# Patient Record
Sex: Female | Born: 1990 | Race: White | Hispanic: No | Marital: Single | Smoking: Never smoker
Health system: Southern US, Community
[De-identification: ages and names within clinical notes are randomized; demographics above are authoritative.]

## PROBLEM LIST (undated history)

## (undated) DIAGNOSIS — I8390 Asymptomatic varicose veins of unspecified lower extremity: Secondary | ICD-10-CM

## (undated) HISTORY — PX: NO PAST SURGERIES: SHX2092

## (undated) HISTORY — DX: Asymptomatic varicose veins of unspecified lower extremity: I83.90

---

## 2018-04-27 ENCOUNTER — Other Ambulatory Visit: Payer: Self-pay

## 2018-04-27 DIAGNOSIS — I83893 Varicose veins of bilateral lower extremities with other complications: Secondary | ICD-10-CM

## 2018-05-01 ENCOUNTER — Encounter: Payer: Self-pay | Admitting: Surgery

## 2018-05-29 ENCOUNTER — Other Ambulatory Visit: Payer: Self-pay

## 2018-05-29 ENCOUNTER — Encounter: Payer: Self-pay | Admitting: Surgery

## 2018-05-29 ENCOUNTER — Ambulatory Visit (HOSPITAL_COMMUNITY)
Admission: RE | Admit: 2018-05-29 | Discharge: 2018-05-29 | Disposition: A | Discharge status: Home / Self Care | Payer: BC Managed Care – PPO | Source: Ambulatory Visit | Attending: Surgery | Admitting: Surgery

## 2018-05-29 ENCOUNTER — Ambulatory Visit (INDEPENDENT_AMBULATORY_CARE_PROVIDER_SITE_OTHER): Payer: BC Managed Care – PPO | Admitting: Surgery

## 2018-05-29 VITALS — BP 128/78 | HR 88 | Temp 98.2°F | Resp 14 | Ht 73.0 in | Wt 250.0 lb

## 2018-05-29 DIAGNOSIS — I83893 Varicose veins of bilateral lower extremities with other complications: Secondary | ICD-10-CM | POA: Diagnosis present

## 2018-05-29 NOTE — Progress Notes (Signed)
[facility] [place]  Patient name: Brenda Pena MRN: [id number] DOB: [birth year] Sex: female   REQUESTING PROVIDER:    Burnis Medin   REASON FOR CONSULT:    Painful varicose veins  HISTORY OF PRESENT ILLNESS:   Brenda Pena is a 28 y.o. female, who is referred for evaluation of varicose veins in her left leg.  The patient states that she recently had an episode of redness and swelling and pain over the course of a varicose vein going across her left thigh.  She was treated with antibiotics as well as warm compresses and this appears to be getting better.  She does note a family history of varicose veins in her mother and father.  She occasionally have swelling in her left leg.  She does wear compression stockings on occasion.  She is a non-smoker.  PAST MEDICAL HISTORY    Past Medical History:  Diagnosis Date  . Varicose vein of leg      FAMILY HISTORY   Family History  Problem Relation Age of Onset  . Arthritis Mother   . Heart disease Mother   . Hypertension Mother   . Atrial fibrillation Mother   . Diabetes Father   . Hypertension Father     SOCIAL HISTORY:   Social History   Socioeconomic History  . Marital status: Unknown    Spouse name: Not on file  . Number of children: Not on file  . Years of education: Not on file  . Highest education level: Not on file  Occupational History  . Not on file  Social Needs  . Financial resource strain: Not on file  . Food insecurity:    Worry: Not on file    Inability: Not on file  . Transportation needs:    Medical: Not on file    Non-medical: Not on file  Tobacco Use  . Smoking status: Never Smoker  . Smokeless tobacco: Never Used  Substance and Sexual Activity  . Alcohol use: Yes    Comment: seldom  . Drug use: Never  . Sexual activity: Not on file  Lifestyle  . Physical activity:    Days per week: Not on file    Minutes per session: Not on  file  . Stress: Not on file  Relationships  . Social connections:    Talks on phone: Not on file    Gets together: Not on file    Attends religious service: Not on file    Active member of club or organization: Not on file    Attends meetings of clubs or organizations: Not on file    Relationship status: Not on file  . Intimate partner violence:    Fear of current or ex partner: Not on file    Emotionally abused: Not on file    Physically abused: Not on file    Forced sexual activity: Not on file  Other Topics Concern  . Not on file  Social History Narrative  . Not on file    ALLERGIES:    No Known Allergies  CURRENT MEDICATIONS:    Current Outpatient Medications  Medication Sig Dispense Refill  . medroxyPROGESTERone (DEPO-PROVERA) 150 MG/ML injection Inject 150 mg into the muscle every 3 (three) months.     No current facility-administered medications for this visit.     REVIEW OF SYSTEMS:   [X]  denotes positive finding, [ ]  denotes negative finding Cardiac  Comments:  Chest pain or chest pressure:    Shortness  of breath upon exertion:    Short of breath when lying flat:    Irregular heart rhythm:        Vascular    Pain in calf, thigh, or hip brought on by ambulation:    Pain in feet at night that wakes you up from your sleep:     Blood clot in your veins:    Leg swelling:  x       Pulmonary    Oxygen at home:    Productive cough:     Wheezing:         Neurologic    Sudden weakness in arms or legs:     Sudden numbness in arms or legs:     Sudden onset of difficulty speaking or slurred speech:    Temporary loss of vision in one eye:     Problems with dizziness:         Gastrointestinal    Blood in stool:      Vomited blood:         Genitourinary    Burning when urinating:     Blood in urine:        Psychiatric    Major depression:         Hematologic    Bleeding problems:    Problems with blood clotting too easily:        Skin    Rashes  or ulcers:        Constitutional    Fever or chills:     PHYSICAL EXAM:   Vitals:   05/29/18 1358  BP: 128/78  Pulse: 88  Resp: 14  Temp: 98.2 F (36.8 C)  TempSrc: Oral  SpO2: 96%  Weight: 250 lb (113.4 kg)  Height: 6\' 1"  (1.854 m)    GENERAL: The patient is a well-nourished female, in no acute distress. The vital signs are documented above. CARDIAC: There is a regular rate and rhythm.  VASCULAR: No significant leg swelling today.  The area of concern is no longer prominent. PULMONARY: Nonlabored respirations ABDOMEN: Soft and non-tender with normal pitched bowel sounds.  MUSCULOSKELETAL: There are no major deformities or cyanosis. NEUROLOGIC: No focal weakness or paresthesias are detected. SKIN: There are no ulcers or rashes noted. PSYCHIATRIC: The patient has a normal affect.  STUDIES:   I have ordered and reviewed her vascular studies with the following findings: Venous Reflux Times Normal value < 0.5 sec +------------------------------+----------+---------+                               Right (ms)Left (ms) +------------------------------+----------+---------+ CFV                                     2303.00   +------------------------------+----------+---------+ GSV at Saphenofemoral junction513.00    2384.00   +------------------------------+----------+---------+ GSV prox thigh                2795.00   2178.00   +------------------------------+----------+---------+ GSV mid thigh                 2949.00   2134.00   +------------------------------+----------+---------+ GSV dist thigh                2773.00             +------------------------------+----------+---------+ GSV prox calf  4716.00             +------------------------------+----------+---------+  Vein Diameters: +------------------------------+----------+---------+                               Right (cm)Left  (cm) +------------------------------+----------+---------+ GSV at Saphenofemoral junction0.88      0.78      +------------------------------+----------+---------+ GSV at prox thigh             0.622     0.447     +------------------------------+----------+---------+ GSV at mid thigh              0.475     0.498     +------------------------------+----------+---------+ GSV at distal thigh           0.438     0.438     +------------------------------+----------+---------+ GSV at knee                   0.366     0.45      +------------------------------+----------+---------+ GSV prox calf                 0.424     0.334     +------------------------------+----------+---------+ SSV origin                    0.452     0.393     +------------------------------+----------+---------+ SSV prox                      0.256     0.242     +------------------------------+----------+---------+ SSV mid                       0.237     0.242     +------------------------------+----------+---------+       Summary: Right: Abnormal reflux times were noted in the great saphenous vein at the saphenofemoral junction, great saphenous vein at the proximal thigh, great saphenous vein at the mid thigh, and great saphenous vein at the prox calf. There is no evidence of deep vein thrombosis in the lower extremity.There is no evidence of superficial venous thrombosis.   Left: Abnormal reflux times were noted in the common femoral vein, great saphenous vein at the saphenofemoral junction, great saphenous vein at the proximal thigh, and great saphenous vein at the mid thigh. Findings consistent with age indeterminate superficial vein thrombosis involving the left superficial veins/varicosities. There is no evidence of deep vein thrombosis in the lower extremity. 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 ASSESSMENT and PLAN   Left leg thrombophlebitis: This appears to have nearly resolved.  Patient does have reflux in bilateral saphenous veins, however she does not have any significant edema.  I have recommended continued observation of her saphenous vein reflux.  We discussed wearing knee-high 20-30 compression stockings for symptomatic relief 1 days that she will be on her feet for a long time.  Ultrasound identified what could potentially be a pseudoaneurysm near the area of her thrombophlebitis.  I told her that this needs to be better evaluated as it was difficult on ultrasound to fully delineate the structure.  I am ordering a CT angiogram of the left leg for clarity.  She  will follow-up after that is been done.   Durene CalWells , MD [facility] Tel [phone] Pager [phone]

## 2018-05-30 ENCOUNTER — Other Ambulatory Visit: Payer: Self-pay

## 2018-05-30 DIAGNOSIS — I729 Aneurysm of unspecified site: Secondary | ICD-10-CM

## 2018-06-08 ENCOUNTER — Telehealth: Payer: Self-pay | Admitting: Surgery

## 2018-06-09 ENCOUNTER — Ambulatory Visit (HOSPITAL_COMMUNITY)
Admission: RE | Admit: 2018-06-09 | Discharge: 2018-06-09 | Disposition: A | Discharge status: Home / Self Care | Payer: BC Managed Care – PPO | Source: Ambulatory Visit | Attending: Surgery | Admitting: Surgery

## 2018-06-09 ENCOUNTER — Encounter (HOSPITAL_COMMUNITY): Payer: Self-pay

## 2018-06-09 ENCOUNTER — Ambulatory Visit (HOSPITAL_COMMUNITY): Admission: RE | Admit: 2018-06-09 | Payer: BC Managed Care – PPO | Source: Ambulatory Visit

## 2018-06-09 DIAGNOSIS — I729 Aneurysm of unspecified site: Secondary | ICD-10-CM | POA: Insufficient documentation

## 2018-06-09 MED ORDER — IOPAMIDOL (ISOVUE-370) INJECTION 76%
100.0000 mL | Freq: Once | INTRAVENOUS | Status: AC | PRN
  Administered 2018-06-09: 100 mL via INTRAVENOUS

## 2018-06-09 MED ORDER — IOPAMIDOL (ISOVUE-370) INJECTION 76%
INTRAVENOUS | Status: AC
  Filled 2018-06-09: qty 100

## 2018-06-09 MED ORDER — SODIUM CHLORIDE (PF) 0.9 % IJ SOLN
INTRAMUSCULAR | Status: AC
  Filled 2018-06-09: qty 100

## 2018-06-12 ENCOUNTER — Ambulatory Visit: Payer: BC Managed Care – PPO | Admitting: Surgery

## 2018-06-12 ENCOUNTER — Encounter: Payer: Self-pay | Admitting: Surgery

## 2018-06-12 ENCOUNTER — Other Ambulatory Visit: Payer: Self-pay

## 2018-06-12 VITALS — BP 117/93 | HR 98 | Temp 97.9°F | Resp 14

## 2018-06-12 DIAGNOSIS — I83893 Varicose veins of bilateral lower extremities with other complications: Secondary | ICD-10-CM | POA: Diagnosis not present

## 2018-06-12 NOTE — Progress Notes (Signed)
V[facility] and Vein Specialist of [place]  Patient name: Brenda Pena MRN: [id number] DOB: 11/16/1990 Sex: female   REASON FOR VISIT:    Follow up  HISOTRY OF PRESENT ILLNESS:    Brenda Pena is a 28 y.o. female who I recently evaluated for varicose veins in her left leg.  The patient states that she recently had an episode of redness and swelling and pain over the course of a varicose vein going across her left thigh.  She was treated with antibiotics as well as warm compresses and this appears to be getting better.  She does note a family history of varicose veins in her mother and father.  She occasionally have swelling in her left leg.  She does wear compression stockings on occasion.  She is a non-smoker.  Her thrombophlebitis appeared to have nearly resolved.  She does have reflux in bilateral saphenous veins however she does not have any significant edema.  We talked about 20-30 compression stockings for symptomatic relief for days that she will be on her feet for long time.  She is back today because ultrasound identified what potentially could be a pseudoaneurysm in the area near her phlebitis.  I felt it needed to be better evaluated with CT scan imaging.  She is back to see the results.  She is not wearing her compression stockings   PAST MEDICAL HISTORY:   Past Medical History:  Diagnosis Date  . Varicose vein of leg      FAMILY HISTORY:   Family History  Problem Relation Age of Onset  . Arthritis Mother   . Heart disease Mother   . Hypertension Mother   . Atrial fibrillation Mother   . Diabetes Father   . Hypertension Father     SOCIAL HISTORY:   Social History   Tobacco Use  . Smoking status: Never Smoker  . Smokeless tobacco: Never Used  Substance Use Topics  . Alcohol use: Yes    Comment: seldom     ALLERGIES:   No Known Allergies   CURRENT MEDICATIONS:   Current Outpatient Medications  Medication  Sig Dispense Refill  . medroxyPROGESTERone (DEPO-PROVERA) 150 MG/ML injection Inject 150 mg into the muscle every 3 (three) months.     No current facility-administered medications for this visit.     REVIEW OF SYSTEMS:   [X]  denotes positive finding, [ ]  denotes negative finding Cardiac  Comments:  Chest pain or chest pressure:    Shortness of breath upon exertion:    Short of breath when lying flat:    Irregular heart rhythm:        Vascular    Pain in calf, thigh, or hip brought on by ambulation:    Pain in feet at night that wakes you up from your sleep:     Blood clot in your veins:    Leg swelling:  x       Pulmonary    Oxygen at home:    Productive cough:     Wheezing:         Neurologic    Sudden weakness in arms or legs:     Sudden numbness in arms or legs:     Sudden onset of difficulty speaking or slurred speech:    Temporary loss of vision in one eye:     Problems with dizziness:         Gastrointestinal    Blood in stool:     Vomited blood:  Genitourinary    Burning when urinating:     Blood in urine:        Psychiatric    Major depression:         Hematologic    Bleeding problems:    Problems with blood clotting too easily:        Skin    Rashes or ulcers:        Constitutional    Fever or chills:      PHYSICAL EXAM:   There were no vitals filed for this visit.  GENERAL: The patient is a well-nourished female, in no acute distress. The vital signs are documented above. CARDIAC: There is a regular rate and rhythm.  VASCULAR: Mild left leg edema.  Nontender varicosities.  No erythema. PULMONARY: Non-labored respirations MUSCULOSKELETAL: There are no major deformities or cyanosis. NEUROLOGIC: No focal weakness or paresthesias are detected. SKIN: There are no ulcers or rashes noted. PSYCHIATRIC: The patient has a normal affect.  STUDIES:   I have reviewed her CT scan with the following findings: 1. 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 The aneurysmal varix abuts a segment of the great saphenous vein. Venous outflow via anterior thigh and medial thigh superficial varicosities appears to be primarily via the great saphenous vein but also potentially via at least one perforator vein in the mid thigh. 2. Normal arterial structures of the left lower extremity. 3. Soft tissue calcification associated with some soft tissue prominence medial to the navicular bone in the left foot which may relate to prior injury.  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  No intervention is required.  I discussed again the importance of wearing compression stockings to prevent long-term complications.  Currently, no further evidence of thrombophlebitis exist.  She will follow-up with me on an as-needed basis if her symptoms return.    Durene Cal, MD [facility] [phone] Pager [phone]

## 2019-11-01 ENCOUNTER — Ambulatory Visit (INDEPENDENT_AMBULATORY_CARE_PROVIDER_SITE_OTHER): Payer: BC Managed Care – PPO | Admitting: Cardiology

## 2019-11-01 ENCOUNTER — Other Ambulatory Visit: Payer: Self-pay

## 2019-11-01 ENCOUNTER — Ambulatory Visit (INDEPENDENT_AMBULATORY_CARE_PROVIDER_SITE_OTHER): Payer: BC Managed Care – PPO

## 2019-11-01 ENCOUNTER — Encounter: Payer: Self-pay | Admitting: Cardiology

## 2019-11-01 DIAGNOSIS — R55 Syncope and collapse: Secondary | ICD-10-CM

## 2019-11-01 DIAGNOSIS — R011 Cardiac murmur, unspecified: Secondary | ICD-10-CM

## 2019-11-01 HISTORY — DX: Syncope and collapse: R55

## 2019-11-01 HISTORY — DX: Cardiac murmur, unspecified: R01.1

## 2019-11-01 NOTE — Progress Notes (Signed)
Cardiology Office Note:    Date:  11/01/2019   ID:  Brenda Pena, DOB July 30, 1990, MRN [id number]  PCP:  Street, Stephanie Coup, MD  Cardiologist:  Garwin Brothers, MD   Referring MD: [id number] Stephanie Coup, *    ASSESSMENT:    1. Syncope and collapse   2. Cardiac murmur    PLAN:    In order of problems listed above:  1. Syncope: I discussed my findings with the patient at length.  There is a suggestion that it appears this vasovagal component.  She also might be low on her blood pressure.  I told the patient to keep herself well-hydrated with salt and water.  Lifestyle modification was urged and she vocalized understanding.  Fall precaution was advised.  She also was advised not to drive in view of syncope and she understood.  Her TSH is normal I reviewed lab work and EKG from primary care physician.  EKG was unremarkable except mildly reduced heart rate. 2. In view of the above she will undergo 2-week monitoring to understand if she has an bradycardia or tachyarrhythmias. 3. Cardiac murmur: Echocardiogram will be done to assess this. 4. Noncardiac work-up will be performed by primary care physician as clinically indicated.  I discussed the above with the patient at extensive length and questions were answered to her satisfaction.   Medication Adjustments/Labs and Tests Ordered: Current medicines are reviewed at length with the patient today.  Concerns regarding medicines are outlined above.  No orders of the defined types were placed in this encounter.  No orders of the defined types were placed in this encounter.    History of Present Illness:    Brenda Pena is a 29 y.o. female who is being seen today for the evaluation of syncope at the request of Street, Stephanie Coup, *.  Patient is a pleasant 29 year old female.  She has past medical history that is not much significant.  In the past month she has had 2 spells of syncope.  The first was a definite fall.  She was  standing up at home and just collapsed.  No chest pain orthopnea PND.  She feels like tunnel vision and then she passes out.  The second time she was able to sit down and prevent her getting hurt.  For this reason she is sent here for an evaluation.  At the time of my evaluation, the patient is alert awake oriented and in no distress.  Interestingly her TSH is normal.  Her EKG done today was fine.  Past Medical History:  Diagnosis Date  . Varicose vein of leg     History reviewed. No pertinent surgical history.  Current Medications: Current Meds  Medication Sig  . buPROPion (WELLBUTRIN SR) 150 MG 12 hr tablet Take 150 mg by mouth 2 (two) times daily.  . cephALEXin (KEFLEX) 500 MG capsule   . escitalopram (LEXAPRO) 10 MG tablet Take 10 mg by mouth daily.  . hydrOXYzine (VISTARIL) 25 MG capsule Take 25 mg by mouth as needed.  . medroxyPROGESTERone (DEPO-PROVERA) 150 MG/ML injection Inject 150 mg into the muscle every 3 (three) months.     Allergies:   Patient has no known allergies.   Social History   Socioeconomic History  . Marital status: Single    Spouse name: Not on file  . Number of children: Not on file  . Years of education: Not on file  . Highest education level: Not on file  Occupational History  . Not on  file  Tobacco Use  . Smoking status: Never Smoker  . Smokeless tobacco: Never Used  Substance and Sexual Activity  . Alcohol use: Yes    Comment: seldom  . Drug use: Never  . Sexual activity: Not on file  Other Topics Concern  . Not on file  Social History Narrative  . Not on file   Social Determinants of Health   Financial Resource Strain:   . Difficulty of Paying Living Expenses:   Food Insecurity:   . Worried About Programme researcher, broadcasting/film/video in the Last Year:   . Barista in the Last Year:   Transportation Needs:   . Freight forwarder (Medical):   Marland Kitchen Lack of Transportation (Non-Medical):   Physical Activity:   . Days of Exercise per Week:   .  Minutes of Exercise per Session:   Stress:   . Feeling of Stress :   Social Connections:   . Frequency of Communication with Friends and Family:   . Frequency of Social Gatherings with Friends and Family:   . Attends Religious Services:   . Active Member of Clubs or Organizations:   . Attends Banker Meetings:   Marland Kitchen Marital Status:      Family History: The patient's family history includes Arthritis in her mother; Atrial fibrillation in her mother; Diabetes in her father; Heart disease in her mother; Hypertension in her father and mother.  ROS:   Please see the history of present illness.    All other systems reviewed and are negative.  EKGs/Labs/Other Studies Reviewed:    The following studies were reviewed today: EKG reveals sinus rhythm and nonspecific ST-T changes.   Recent Labs: No results found for requested labs within last 8760 hours.  Recent Lipid Panel No results found for: CHOL, TRIG, HDL, CHOLHDL, VLDL, LDLCALC, LDLDIRECT  Physical Exam:    VS:  BP 116/88   Pulse 68   Ht 6\' 1"  (1.854 m)   Wt 211 lb 9.6 oz (96 kg)   SpO2 98%   BMI 27.92 kg/m     Wt Readings from Last 3 Encounters:  11/01/19 211 lb 9.6 oz (96 kg)  05/29/18 250 lb (113.4 kg)     GEN: Patient is in no acute distress HEENT: Normal NECK: No JVD; No carotid bruits LYMPHATICS: No lymphadenopathy CARDIAC: S1 S2 regular, 2/6 systolic murmur at the apex. RESPIRATORY:  Clear to auscultation without rales, wheezing or rhonchi  ABDOMEN: Soft, non-tender, non-distended MUSCULOSKELETAL:  No edema; No deformity  SKIN: Warm and dry NEUROLOGIC:  Alert and oriented x 3 PSYCHIATRIC:  Normal affect    Signed, 07/28/18, MD  11/01/2019 3:22 PM     [facility]

## 2019-11-01 NOTE — Patient Instructions (Signed)
Medication Instructions:  No medication changes. *If you need a refill on your cardiac medications before your next appointment, please call your pharmacy*   Lab Work: None ordered If you have labs (blood work) drawn today and your tests are completely normal, you will receive your results only by: Marland Kitchen MyChart Message (if you have MyChart) OR . A paper copy in the mail If you have any lab test that is abnormal or we need to change your treatment, we will call you to review the results.  Testing/Procedures: Your physician has requested that you have an echocardiogram. Echocardiography is a painless test that uses sound waves to create images of your heart. It provides your doctor with information about the size and shape of your heart and how well your heart's chambers and valves are working. This procedure takes approximately one hour. There are no restrictions for this procedure.   WHY IS MY DOCTOR PRESCRIBING ZIO? The Zio system is proven and trusted by physicians to detect and diagnose irregular heart rhythms -- and has been prescribed to hundreds of thousands of patients.  The FDA has cleared the Zio system to monitor for many different kinds of irregular heart rhythms. In a study, physicians were able to reach a diagnosis 90% of the time with the Zio system1.  You can wear the Zio monitor -- a small, discreet, comfortable patch -- during your normal day-to-day activity, including while you sleep, shower, and exercise, while it records every single heartbeat for analysis.  1Barrett, P., et al. Comparison of 24 Hour Holter Monitoring Versus 14 Day Novel Adhesive Patch Electrocardiographic Monitoring. American Journal of Medicine, 2014.  ZIO VS. HOLTER MONITORING The Zio monitor can be comfortably worn for up to 14 days. Holter monitors can be worn for 24 to 48 hours, limiting the time to record any irregular heart rhythms you may have. Zio is able to capture data for the 51% of patients  who have their first symptom-triggered arrhythmia after 48 hours.1  LIVE WITHOUT RESTRICTIONS The Zio ambulatory cardiac monitor is a small, unobtrusive, and water-resistant patch--you might even forget you're wearing it. The Zio monitor records and stores every beat of your heart, whether you're sleeping, working out, or showering.  Wear the monitor for 2 weeks, remove on 11/15/19.  Follow-Up: At [facility], you and your health needs are our priority.  As part of our continuing mission to provide you with exceptional heart care, we have created designated Provider Care Teams.  These Care Teams include your primary Cardiologist (physician) and Advanced Practice Providers (APPs -  Physician Assistants and Nurse Practitioners) who all work together to provide you with the care you need, when you need it.  We recommend signing up for the patient portal called "MyChart".  Sign up information is provided on this After Visit Summary.  MyChart is used to connect with patients for Virtual Visits (Telemedicine).  Patients are able to view lab/test results, encounter notes, upcoming appointments, etc.  Non-urgent messages can be sent to your provider as well.   To learn more about what you can do with MyChart, go to [email or web address].    Your next appointment:   4 month(s)  The format for your next appointment:   In Person  Provider:   Belva Crome, MD   Other Instructions NA

## 2019-11-22 ENCOUNTER — Other Ambulatory Visit: Payer: Self-pay

## 2019-11-22 ENCOUNTER — Ambulatory Visit (INDEPENDENT_AMBULATORY_CARE_PROVIDER_SITE_OTHER): Payer: BC Managed Care – PPO

## 2019-11-22 DIAGNOSIS — R55 Syncope and collapse: Secondary | ICD-10-CM

## 2019-11-22 LAB — ECHOCARDIOGRAM COMPLETE
Area-P 1/2: 4.39 cm2
Calc EF: 50.8 %
S' Lateral: 3.2 cm
Single Plane A2C EF: 51.3 %
Single Plane A4C EF: 53.3 %

## 2019-11-22 NOTE — Progress Notes (Signed)
Complete echocardiogram has been performed.  Jimmy Thanya Cegielski RDCS, RVT 

## 2019-11-23 ENCOUNTER — Telehealth: Payer: Self-pay

## 2019-11-23 NOTE — Telephone Encounter (Signed)
Called patient informed her of results. No further questions.  

## 2019-11-23 NOTE — Telephone Encounter (Signed)
Left message on patients voicemail to please return our call.   

## 2019-11-23 NOTE — Telephone Encounter (Signed)
-----   Message from Rajan R Revankar, MD sent at 11/23/2019 10:11 AM EDT ----- The results of the study is unremarkable. Please inform patient. I will discuss in detail at next appointment. Cc  primary care/referring physician Rajan R Revankar, MD 11/23/2019 10:11 AM  

## 2019-11-23 NOTE — Telephone Encounter (Signed)
Follow up  ° ° °Patient is returning call.  °

## 2019-11-23 NOTE — Telephone Encounter (Signed)
Spoke with patient regarding results and recommendation.  Patient verbalizes understanding and is agreeable to plan of care. Advised patient to call back with any issues or concerns.  

## 2020-03-04 ENCOUNTER — Other Ambulatory Visit: Payer: Self-pay

## 2020-03-04 DIAGNOSIS — I8390 Asymptomatic varicose veins of unspecified lower extremity: Secondary | ICD-10-CM | POA: Insufficient documentation

## 2020-03-05 ENCOUNTER — Other Ambulatory Visit: Payer: Self-pay

## 2020-03-05 ENCOUNTER — Ambulatory Visit: Payer: BC Managed Care – PPO | Admitting: Cardiology

## 2020-03-05 ENCOUNTER — Encounter: Payer: Self-pay | Admitting: Cardiology

## 2020-03-05 VITALS — BP 114/76 | HR 88 | Ht 73.0 in | Wt 213.0 lb

## 2020-03-05 DIAGNOSIS — R011 Cardiac murmur, unspecified: Secondary | ICD-10-CM

## 2020-03-05 DIAGNOSIS — R55 Syncope and collapse: Secondary | ICD-10-CM

## 2020-03-05 NOTE — Progress Notes (Signed)
Cardiology Office Note:    Date:  03/05/2020   ID:  Brenda Pena, DOB October 02, 1990, MRN [id number]  PCP:  Street, Stephanie Coup, MD  Cardiologist:  Garwin Brothers, MD   Referring MD: [address] Stephanie Coup, *    ASSESSMENT:    1. Cardiac murmur   2. Syncope and collapse    PLAN:    In order of problems listed above:  1. Syncope: I discussed my findings with the patient at length.  Cardiovascular evaluation namely echo and monitoring was unremarkable.  I told her to get back to her primary care physician about this.  Final evaluation will be given by her primary care physician.  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  Again my impression is that there is some issue of baseline low blood pressure with this lady and it was aggravated probably by the weather and not keeping her well-hydrated.  She now keeps herself well-hydrated and her symptoms have not recurred.  She may need other non cardiac evaluation as felt appropriate by her primary care physician. 2. Echocardiogram and event monitor report I discussed below and they are within normal limits.  From a cardiac standpoint her evaluation has been unremarkable. 3. Patient will be seen in follow-up appointment in 6 months or earlier if the patient has any concerns    Medication Adjustments/Labs and Tests Ordered: Current medicines are reviewed at length with the patient today.  Concerns regarding medicines are outlined above.  No orders of the defined types were placed in this encounter.  No orders of the defined types were placed in this encounter.    No chief complaint on file.    History of Present Illness:    Brenda Pena is a 29 y.o. female.  Patient has history of syncope.  It appears to me that the patient had episode of syncope mostly when she was trying to change posture from sitting to standing position.  She has never had a real syncope being in  one position such as sitting position.  I told her to keep her self well-hydrated with extra salt and water in the diet and she tells me that it has helped her significantly.  Subsequently she has never had a passing out spell or dizzy spells.  At the time of my evaluation, the patient is alert awake oriented and in no distress.  Past Medical History:  Diagnosis Date  . Cardiac murmur 11/01/2019  . Syncope and collapse 11/01/2019  . Varicose vein of leg     Past Surgical History:  Procedure Laterality Date  . NO PAST SURGERIES      Current Medications: Current Meds  Medication Sig  . buPROPion (WELLBUTRIN SR) 150 MG 12 hr tablet Take 150 mg by mouth 2 (two) times daily.  Marland Kitchen escitalopram (LEXAPRO) 10 MG tablet Take 10 mg by mouth daily.  . hydrOXYzine (VISTARIL) 25 MG capsule Take 25 mg by mouth as needed.  . medroxyPROGESTERone (DEPO-PROVERA) 150 MG/ML injection Inject 150 mg into the muscle every 3 (three) months.     Allergies:   Patient has no known allergies.   Social History   Socioeconomic History  . Marital status: Single    Spouse name: Not on file  . Number of children: Not on file  . Years of education: Not on file  . Highest education level: Not on file  Occupational History  . Not on file  Tobacco Use  . Smoking status: Never Smoker  . Smokeless tobacco: Never Used  Substance and Sexual Activity  . Alcohol use: Yes    Comment: seldom  . Drug use: Never  . Sexual activity: Not on file  Other Topics Concern  . Not on file  Social History Narrative  . Not on file   Social Determinants of Health   Financial Resource Strain:   . Difficulty of Paying Living Expenses: Not on file  Food Insecurity:   . Worried About Programme researcher, broadcasting/film/video in the Last Year: Not on file  . Ran Out of Food in the Last Year: Not on file  Transportation Needs:   . Lack of Transportation (Medical): Not on file  . Lack of Transportation (Non-Medical): Not on file  Physical Activity:     . Days of Exercise per Week: Not on file  . Minutes of Exercise per Session: Not on file  Stress:   . Feeling of Stress : Not on file  Social Connections:   . Frequency of Communication with Friends and Family: Not on file  . Frequency of Social Gatherings with Friends and Family: Not on file  . Attends Religious Services: Not on file  . Active Member of Clubs or Organizations: Not on file  . Attends Banker Meetings: Not on file  . Marital Status: Not on file     Family History: The patient's family history includes Arthritis in her mother; Atrial fibrillation in her mother; Diabetes in her father; Heart disease in her mother; Hypertension in her father and mother.  ROS:   Please see the history of present illness.    All other systems reviewed and are negative.  EKGs/Labs/Other Studies Reviewed:    The following studies were reviewed today: Conclusion:  Event monitor was unremarkable and symptoms did not correlate with any findings.  Interpreting  cardiologist: Garwin Brothers, MD  Date: 11/29/2019 2:15 PM  IMPRESSIONS    1. Left ventricular ejection fraction, by estimation, is 55 to 60%. The  left ventricle has normal function. The left ventricle has no regional  wall motion abnormalities. Left ventricular diastolic parameters were  normal.  2. Right ventricular systolic function is normal. The right ventricular  size is normal. There is normal pulmonary artery systolic pressure.  3. The mitral valve is normal in structure. No evidence of mitral valve  regurgitation. No evidence of mitral stenosis.  4. The aortic valve is tricuspid. Aortic valve regurgitation is not  visualized. No aortic stenosis is present.  5. The inferior vena cava is normal in size with greater than 50%  respiratory variability, suggesting right atrial pressure of 3 mmHg.     Recent Labs: No results found for requested labs within last 8760 hours.  Recent Lipid Panel No  results found for: CHOL, TRIG, HDL, CHOLHDL, VLDL, LDLCALC, LDLDIRECT  Physical Exam:    VS:  BP 114/76   Pulse 88   Ht 6\' 1"  (1.854 m)   Wt 213 lb (96.6 kg)   SpO2 97%   BMI 28.10 kg/m     Wt Readings from Last 3 Encounters:  03/05/20 213 lb (96.6 kg)  11/01/19 211 lb 9.6 oz (96 kg)  05/29/18 250 lb (113.4 kg)     GEN: Patient is in no acute distress HEENT: Normal NECK: No JVD; No carotid bruits LYMPHATICS: No lymphadenopathy CARDIAC: Hear sounds regular, 2/6 systolic murmur at the apex. RESPIRATORY:  Clear to auscultation without rales, wheezing or  rhonchi  ABDOMEN: Soft, non-tender, non-distended MUSCULOSKELETAL:  No edema; No deformity  SKIN: Warm and dry NEUROLOGIC:  Alert and oriented x 3 PSYCHIATRIC:  Normal affect   Signed, Garwin Brothers, MD  03/05/2020 9:33 AM    [facility]

## 2020-03-05 NOTE — Patient Instructions (Signed)

## 2020-03-16 IMAGING — CT CT ANGIO EXTREM LOW*L*
2 of 10 series · 11 of 46 positions shown, 14 images · IV contrast (ISOVUE 370)
Comparison: None.

CLINICAL DATA: History of superficial venous insufficiency of the
left lower extremity with prior superficial thrombophlebitis of
varicosities of the left thigh. Sonographic evaluation has revealed
a thrombosed pseudoaneurysm of the thigh that appear to abut the
great saphenous vein by ultrasound. Further evaluation is performed
with CT angiography.

EXAM:
CT ANGIOGRAPHY OF THE LEFT LOWER EXTREMITY
TECHNIQUE: Multidetector CT imaging of the left lowerwas performed using the
standard protocol during bolus administration of intravenous
contrast. Multiplanar CT image reconstructions and MIPs were
obtained to evaluate the vascular anatomy. Arterial and venous
phases of imaging were performed.
CONTRAST:  100mL Z9Z6CL-USM IOPAMIDOL (Z9Z6CL-USM) INJECTION 76%

[Series 5: axial arterial · axial · arterial · 0.59mm/px · z∈[-1254,-204]mm · 10 of 404 slices shown, 13 images]
[im 27/404  soft-tissue]
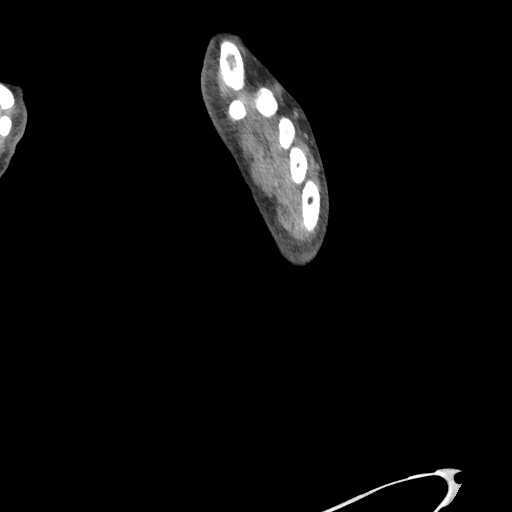
[im 27/404  bone]
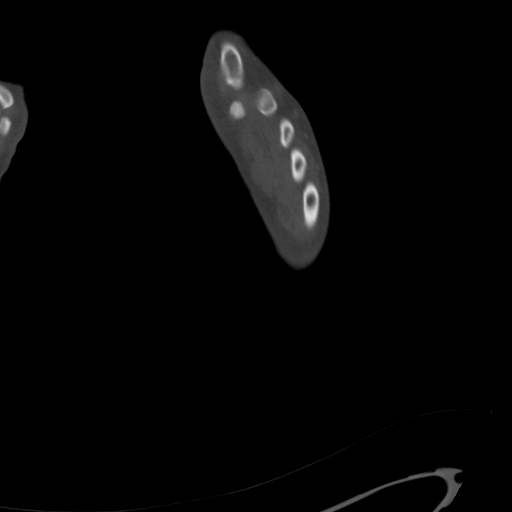
[im 81/404  soft-tissue]
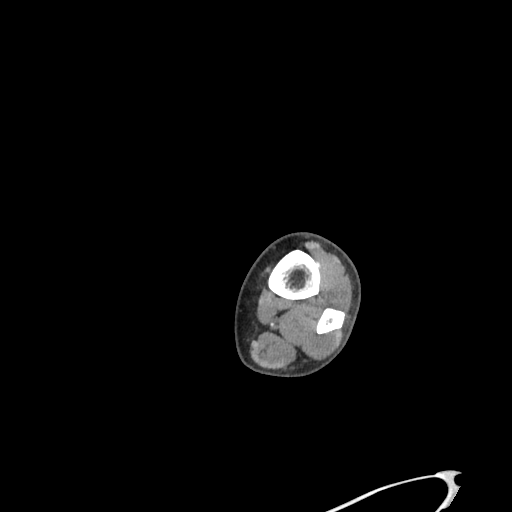
[im 135/404  soft-tissue]
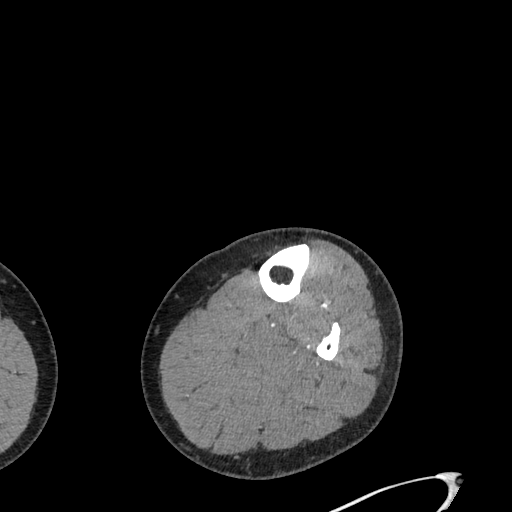
[im 189/404  soft-tissue]
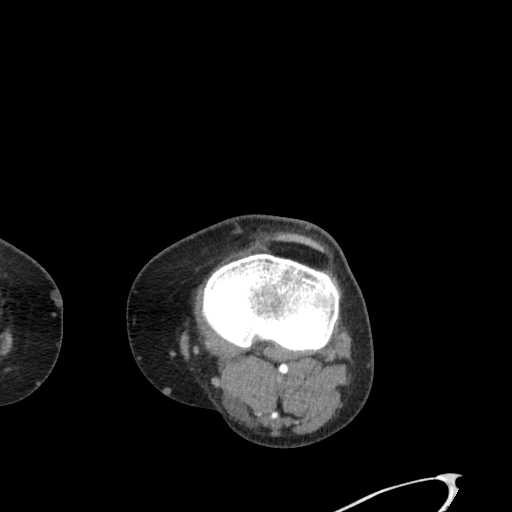
[im 215/404  soft-tissue]
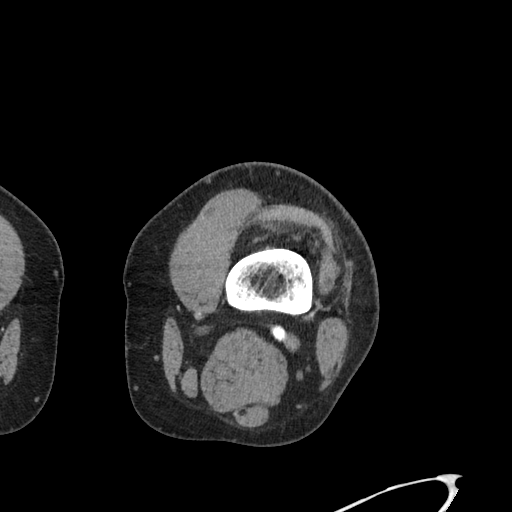
[im 269/404  soft-tissue]
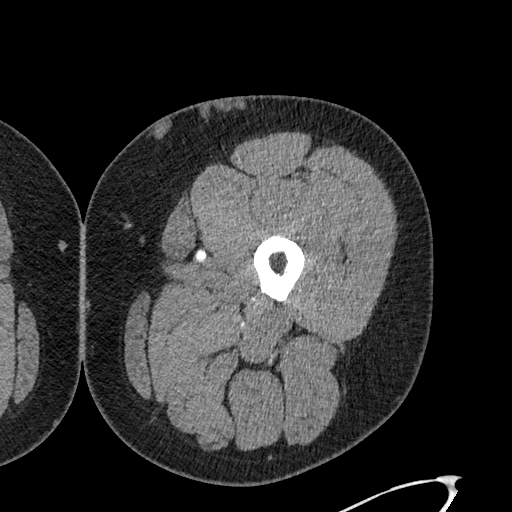
[im 296/404  lung]
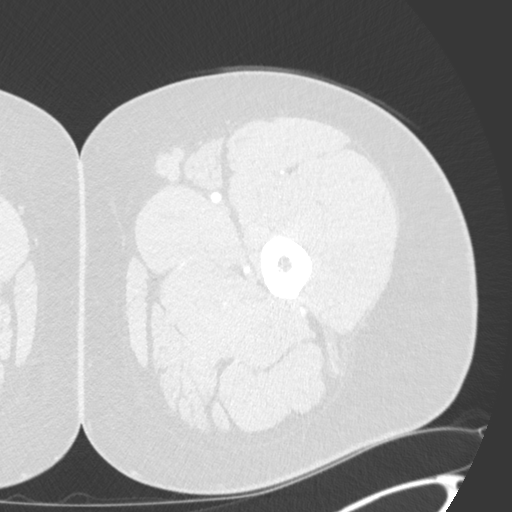
[im 323/404  soft-tissue]
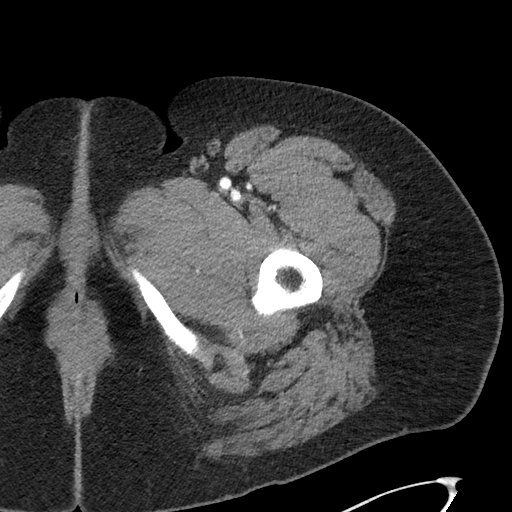
[im 323/404  lung]
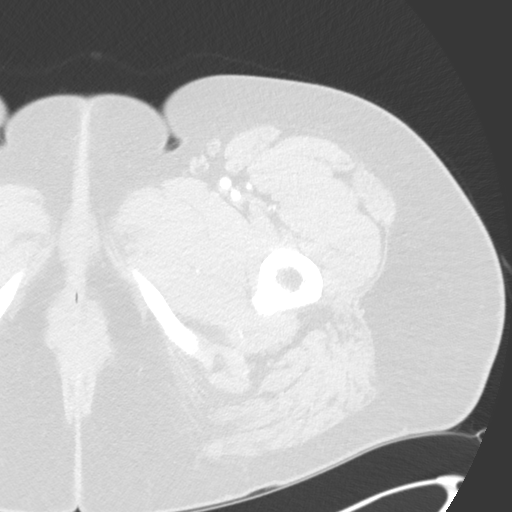
[im 350/404  lung]
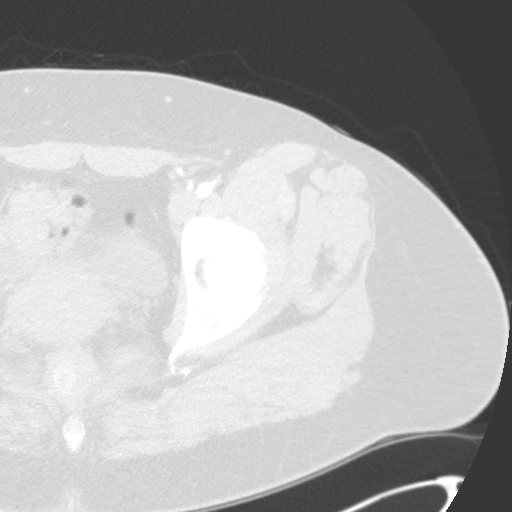
[im 377/404  soft-tissue]
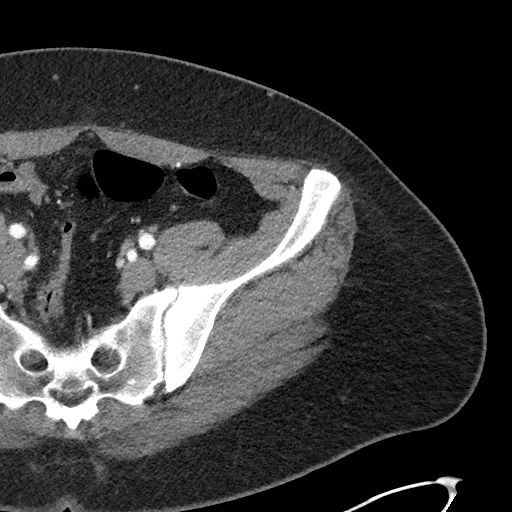
[im 377/404  lung]
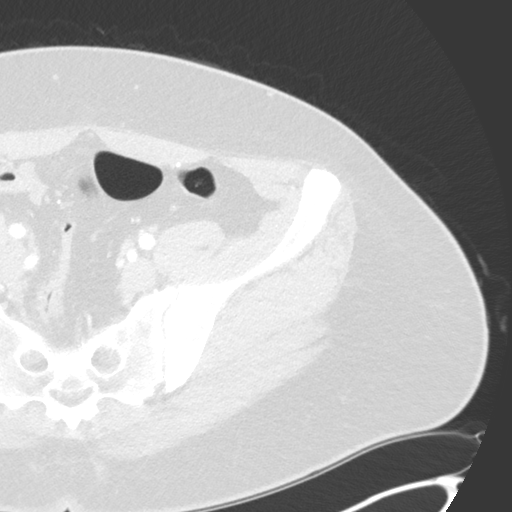

[Series 9: coronal · coronal · 0.61mm/px · 1 of 180 slices shown]
[im 90/180  soft-tissue]
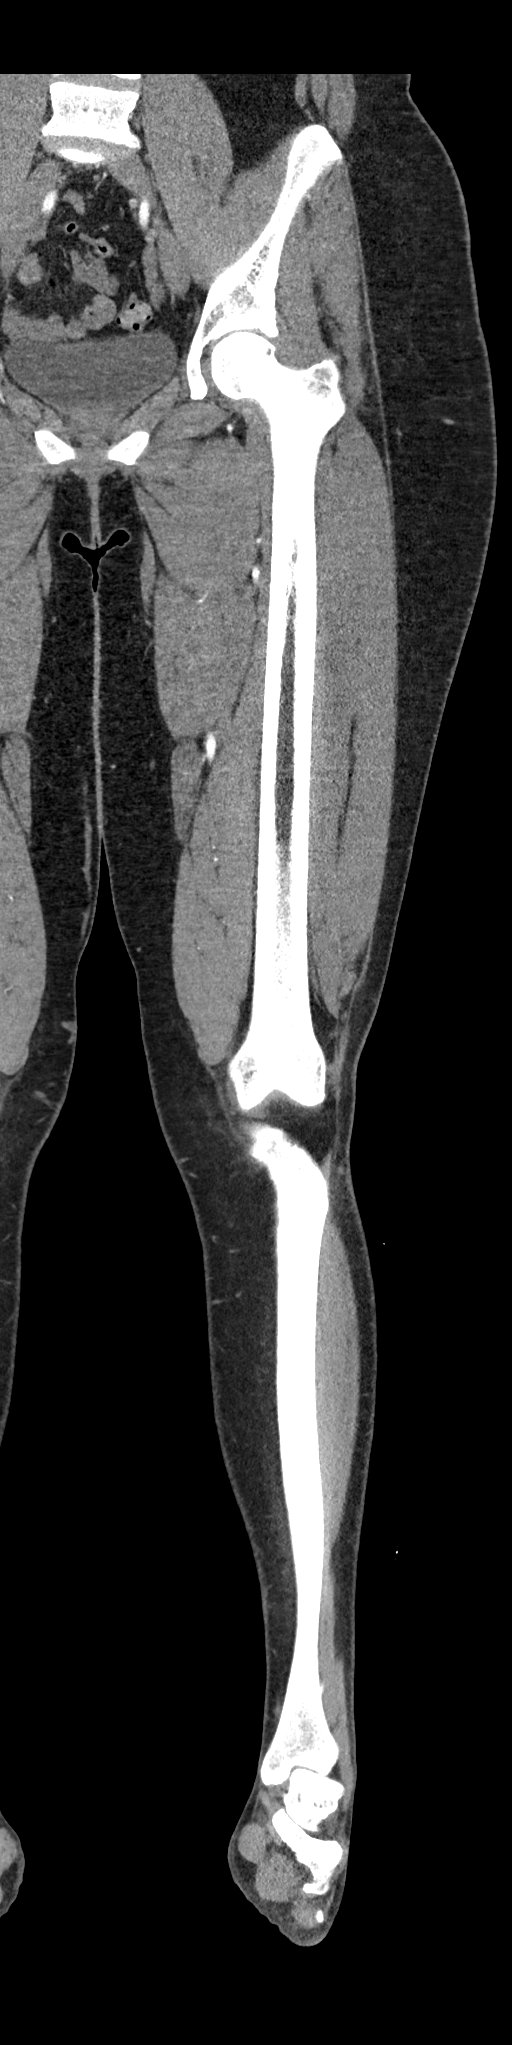

[11 of 46 positions shown; findings below may reference images not displayed]

FINDINGS: Arterial:

Artery supplying the left lower extremity are normal. The common
iliac, external iliac, internal iliac, common femoral, superficial
femoral, profunda femoral, popliteal, posterior tibial, peroneal and
anterior tibial arteries all demonstrate normal patency without
evidence of occlusive disease, atherosclerosis, aneurysm or spasm.
Dominant runoff below the knee is via the anterior tibial artery.

Venous:

Venous phase imaging demonstrates normal patency of deep venous
structures including the iliac veins, common femoral vein, femoral
vein, deep femoral vein, popliteal vein and visualized segments of
the tibial veins.

No significant calf varicosities are identified. Beginning in the
distal thigh, there are prominent superficial varicosities of the
anterior thigh and medial thigh. Medial thigh varicosities
communicate with at least one patent perforator vein in mid thigh
and ultimately a small caliber great saphenous vein. Prominent
anterior thigh varicosities ultimately communicate with branches
that empty into the great saphenous vein at the mid thigh level.
Some of the anterior thigh varicosities also communicate with what
appears to be a thrombosed or partially thrombosed focal aneurysmal
varix measuring approximately 1.5 x 1.9 x 1.3 cm and corresponding
in location to the sonographic abnormality at the juncture of
proximal and mid thigh in the medial soft tissues. This varix abuts
the great saphenous vein and may have developed at a region
stricture or occlusion of varicosity outflow. There are other
superficial venous pathways for anterior thigh varicosity outflow
that ultimately communicate with the GSV in the mid thigh.

Other than some visible thrombus in the focal aneurysmal varix, no
other superficial venous thrombus is identified by CT.

Other:

There are some tiny nonenlarged left inguinal lymph nodes that are
not of concern. No pathology is identified in the visualized portion
of the left pelvis. No musculoskeletal pathology is identified in
the lower extremity except for some visible calcification associated
with some soft tissue prominence medial to the navicular bone in the
foot which may relate to prior injury.

Review of the MIP images confirms the above findings.
IMPRESSION: 1. Aneurysmal structure seen by duplex ultrasound corresponds to a
thrombosed or partially thrombosed focal aneurysmal varix
communicating with varicosities of the anterior thigh. The
aneurysmal varix abuts a segment of the great saphenous vein. Venous
outflow via anterior thigh and medial thigh superficial varicosities
appears to be primarily via the great saphenous vein but also
potentially via at least one perforator vein in the mid thigh.
2. Normal arterial structures of the left lower extremity.
3. Soft tissue calcification associated with some soft tissue
prominence medial to the navicular bone in the left foot which may
relate to prior injury.

## 2022-04-16 ENCOUNTER — Other Ambulatory Visit (HOSPITAL_BASED_OUTPATIENT_CLINIC_OR_DEPARTMENT_OTHER): Payer: Self-pay | Admitting: Family Medicine

## 2022-04-16 DIAGNOSIS — Z86718 Personal history of other venous thrombosis and embolism: Secondary | ICD-10-CM

## 2022-04-17 ENCOUNTER — Ambulatory Visit (HOSPITAL_BASED_OUTPATIENT_CLINIC_OR_DEPARTMENT_OTHER): Payer: BC Managed Care – PPO

## 2023-08-23 ENCOUNTER — Other Ambulatory Visit: Payer: Self-pay | Admitting: Medical Genetics

## 2023-08-24 ENCOUNTER — Other Ambulatory Visit (HOSPITAL_COMMUNITY)
Admission: RE | Admit: 2023-08-24 | Discharge: 2023-08-24 | Disposition: A | Discharge status: Home / Self Care | Payer: Self-pay | Source: Ambulatory Visit | Attending: Oncology | Admitting: Oncology

## 2023-09-02 LAB — GENECONNECT MOLECULAR SCREEN: Genetic Analysis Overall Interpretation: NEGATIVE
